# Patient Record
Sex: Male | Born: 1935 | Race: Black or African American | Hispanic: No | Marital: Married | Smoking: Never smoker
Health system: Southern US, Community
[De-identification: ages and names within clinical notes are randomized; demographics above are authoritative.]

## PROBLEM LIST (undated history)

## (undated) DIAGNOSIS — G459 Transient cerebral ischemic attack, unspecified: Secondary | ICD-10-CM

## (undated) DIAGNOSIS — H409 Unspecified glaucoma: Secondary | ICD-10-CM

## (undated) DIAGNOSIS — E7800 Pure hypercholesterolemia, unspecified: Secondary | ICD-10-CM

## (undated) HISTORY — PX: HERNIA REPAIR: SHX51

## (undated) HISTORY — DX: Pure hypercholesterolemia, unspecified: E78.00

## (undated) HISTORY — PX: OTHER SURGICAL HISTORY: SHX169

---

## 2010-11-10 ENCOUNTER — Ambulatory Visit (HOSPITAL_COMMUNITY)
Admission: RE | Admit: 2010-11-10 | Discharge: 2010-11-10 | Disposition: A | Discharge status: Home / Self Care | Payer: Medicare Other | Source: Ambulatory Visit | Attending: Ophthalmology | Admitting: Ophthalmology

## 2010-11-10 DIAGNOSIS — H269 Unspecified cataract: Secondary | ICD-10-CM | POA: Insufficient documentation

## 2012-01-01 ENCOUNTER — Observation Stay (HOSPITAL_COMMUNITY): Payer: Medicare Other

## 2012-01-01 ENCOUNTER — Emergency Department (HOSPITAL_COMMUNITY): Payer: Medicare Other

## 2012-01-01 ENCOUNTER — Observation Stay (HOSPITAL_COMMUNITY)
Admission: EM | Admit: 2012-01-01 | Discharge: 2012-01-02 | Disposition: A | Discharge status: Home / Self Care | Payer: Medicare Other | Attending: Emergency Medicine | Admitting: Emergency Medicine

## 2012-01-01 ENCOUNTER — Encounter (HOSPITAL_COMMUNITY): Payer: Self-pay

## 2012-01-01 ENCOUNTER — Other Ambulatory Visit: Payer: Self-pay

## 2012-01-01 DIAGNOSIS — R29810 Facial weakness: Secondary | ICD-10-CM | POA: Insufficient documentation

## 2012-01-01 DIAGNOSIS — I639 Cerebral infarction, unspecified: Secondary | ICD-10-CM

## 2012-01-01 DIAGNOSIS — H409 Unspecified glaucoma: Secondary | ICD-10-CM | POA: Insufficient documentation

## 2012-01-01 DIAGNOSIS — Z79899 Other long term (current) drug therapy: Secondary | ICD-10-CM | POA: Insufficient documentation

## 2012-01-01 DIAGNOSIS — Z8673 Personal history of transient ischemic attack (TIA), and cerebral infarction without residual deficits: Secondary | ICD-10-CM | POA: Insufficient documentation

## 2012-01-01 DIAGNOSIS — Z7982 Long term (current) use of aspirin: Secondary | ICD-10-CM | POA: Insufficient documentation

## 2012-01-01 DIAGNOSIS — C384 Malignant neoplasm of pleura: Principal | ICD-10-CM | POA: Insufficient documentation

## 2012-01-01 HISTORY — DX: Transient cerebral ischemic attack, unspecified: G45.9

## 2012-01-01 HISTORY — DX: Unspecified glaucoma: H40.9

## 2012-01-01 LAB — PROTIME-INR
INR: 0.92 (ref 0.00–1.49)
Prothrombin Time: 12.3 seconds (ref 11.6–15.2)

## 2012-01-01 LAB — CBC WITH DIFFERENTIAL/PLATELET
Basophils Absolute: 0 10*3/uL (ref 0.0–0.1)
Basophils Relative: 0 % (ref 0–1)
Eosinophils Absolute: 0.1 10*3/uL (ref 0.0–0.7)
Eosinophils Relative: 2 % (ref 0–5)
Lymphocytes Relative: 23 % (ref 12–46)
MCH: 28.9 pg (ref 26.0–34.0)
MCV: 87.7 fL (ref 78.0–100.0)
Platelets: 175 10*3/uL (ref 150–400)
RDW: 13.4 % (ref 11.5–15.5)
WBC: 5.2 10*3/uL (ref 4.0–10.5)

## 2012-01-01 LAB — BASIC METABOLIC PANEL
Calcium: 9.3 mg/dL (ref 8.4–10.5)
GFR calc non Af Amer: 61 mL/min — ABNORMAL LOW (ref >90)
Sodium: 139 mEq/L (ref 135–145)

## 2012-01-01 LAB — LIPID PANEL
HDL: 48 mg/dL (ref >39)
LDL Cholesterol: 63 mg/dL (ref 0–99)

## 2012-01-01 MED ORDER — ASPIRIN 325 MG PO TABS
325.0000 mg | ORAL_TABLET | Freq: Every day | ORAL | Status: DC
  Administered 2012-01-01 – 2012-01-02 (×2): 325 mg via ORAL
  Filled 2012-01-01 (×2): qty 1

## 2012-01-01 MED ORDER — ACETAMINOPHEN 325 MG PO TABS: 650.0000 mg | ORAL_TABLET | ORAL | Status: DC | PRN

## 2012-01-01 NOTE — ED Notes (Addendum)
EKG given to Dr. Bernette Mayers

## 2012-01-01 NOTE — ED Notes (Signed)
Report given to eme, rn.  Pt transported to room.

## 2012-01-01 NOTE — ED Provider Notes (Signed)
History     CSN: [id number]  Arrival date & time 01/01/12  Barry Brunner   First MD Initiated Contact with Patient 01/01/12 1955      Chief Complaint  Patient presents with  . Dizziness    (Consider location/radiation/quality/duration/timing/severity/associated sxs/prior treatment) HPI Pt with remote history of TIA [place] just [place] to arrival. She states he complained of feeling funny in the head and then walking sideways. He then started having trouble getting his words out and had a facial droop. These symptoms lasted about 10-15 minutes but is now feeling better. He is able to talk now, still states he 'feels funny' in his R hand and leg.   Past Medical History  Diagnosis Date  . Glaucoma   . TIA (transient ischemic attack)     Past Surgical History  Procedure Date  . Abdominal surgery     hernia    No family history on file.  History  Substance Use Topics  . Smoking status: Never Smoker   . Smokeless tobacco: Not on file  . Alcohol Use: No      Review of Systems All other systems reviewed and are negative except as noted in HPI.   Allergies  Review of patient's allergies indicates no known allergies.  Home Medications   Current Outpatient Rx  Name  Route  Sig  Dispense  Refill  . ASPIRIN EC 81 MG PO TBEC   Oral   Take 81 mg by mouth daily.         Marland Kitchen BIMATOPROST 0.01 % OP SOLN   Left Eye   Place 1 drop into the left eye at bedtime.         Marland Kitchen SIMVASTATIN 20 MG PO TABS   Oral   Take 20 mg by mouth every evening.           BP 108/56  Pulse 73  Temp 97.9 F (36.6 C) (Oral)  Resp 17  Ht 5\' 6"  (1.676 m)  Wt 155 lb (70.308 kg)  BMI 25.02 kg/m2  SpO2 98%  Physical Exam  Nursing note and vitals reviewed. Constitutional: He is oriented to person, place, and time. He appears well-developed and well-nourished.  HENT:  Head: Normocephalic and atraumatic.  Eyes: EOM are normal. Pupils are equal, round, and reactive to  light.  Neck: Normal range of motion. Neck supple.  Cardiovascular: Normal rate, normal heart sounds and intact distal pulses.   Pulmonary/Chest: Effort normal and breath sounds normal.  Abdominal: Bowel sounds are normal. He exhibits no distension. There is no tenderness.  Musculoskeletal: Normal range of motion. He exhibits no edema and no tenderness.  Neurological: He is alert and oriented to person, place, and time. He has normal strength. He displays normal reflexes. No cranial nerve deficit or sensory deficit. He exhibits normal muscle tone. Coordination normal.  Skin: Skin is warm and dry. No rash noted.  Psychiatric: He has a normal mood and affect.    ED Course  Procedures (including critical care time)  Labs Reviewed  BASIC METABOLIC PANEL - Abnormal; Notable for the following:    Glucose, Bld 111 (*)     GFR calc non Af Amer 61 (*)     GFR calc Af Amer 71 (*)     All other components within normal limits  CBC WITH DIFFERENTIAL  PROTIME-INR  APTT  TROPONIN I  HEMOGLOBIN A1C  LIPID PANEL   Dg Chest 2 View  01/01/2012  *RADIOLOGY REPORT*  Clinical Data: Chest pain.  TIA.  CHEST - 2 VIEW  Comparison: None.  Findings: Normal heart size and pulmonary vascularity.  Slightly shallow inspiration.  Scattered calcified granulomas.  No focal airspace consolidation.  No blunting of costophrenic angles.  No pneumothorax.  Tortuous aorta.  IMPRESSION: No evidence of active pulmonary disease.   Original Report Authenticated By: Burman Nieves, M.D.    Ct Head Wo Contrast  01/01/2012  *RADIOLOGY REPORT*  Clinical Data: Dizziness  CT HEAD WITHOUT CONTRAST  Technique:  Contiguous axial images were obtained from the base of the skull through the vertex without contrast.  Comparison: None.  Findings: No acute intracranial hemorrhage.  No focal mass lesion. No CT evidence of acute infarction.   No midline shift or mass effect.  No hydrocephalus.  Basilar cisterns are patent.  There is mild  deep white matter hypodensities. Paranasal sinuses and mastoid air cells are clear.  Orbits are normal.  IMPRESSION: No acute intracranial findings.   Original Report Authenticated By: Genevive Bi, M.D.      No diagnosis found.    MDM   Date: 01/01/2012  Rate: 55  Rhythm: normal sinus bradycardia rhythm  QRS Axis: normal  Intervals: normal  ST/T Wave abnormalities: normal  Conduction Disutrbances: none  Narrative Interpretation: unremarkable   Pt with likely TIA. Will check CT and labs. Anticipate needing further evaluation on TIA protocol. Pt and family amenable.          Charles B. Bernette Mayers, MD 01/01/12 2150

## 2012-01-01 NOTE — ED Notes (Signed)
Patient transported to MRI 

## 2012-01-01 NOTE — ED Notes (Signed)
[place] got [place] once he stood up to get on bus.  Pt denies any weakness. States he has a slight headache that started when he got dizzy.  Pt does have hx of tia.

## 2012-01-02 DIAGNOSIS — G459 Transient cerebral ischemic attack, unspecified: Secondary | ICD-10-CM

## 2012-01-02 LAB — HEMOGLOBIN A1C
Hgb A1c MFr Bld: 6.1 % — ABNORMAL HIGH (ref <5.7)
Mean Plasma Glucose: 128 mg/dL — ABNORMAL HIGH (ref <117)

## 2012-01-02 MED ORDER — CLOPIDOGREL BISULFATE 75 MG PO TABS: 75.0000 mg | ORAL_TABLET | Freq: Every day | ORAL | Status: DC

## 2012-01-02 NOTE — Progress Notes (Signed)
VASCULAR LAB PRELIMINARY  PRELIMINARY  PRELIMINARY  PRELIMINARY  Carotid Dopplers completed.    Preliminary report:  There is no ICA stenosis.  Vertebral artery flow is antegrade.  Mikela Senn, RVT 01/02/2012, 7:48 AM

## 2012-01-02 NOTE — ED Notes (Signed)
BREAKFAST ORDERED  

## 2012-01-02 NOTE — ED Notes (Signed)
DR ZOXWR HAS REVIEWED RESULTS AND HAS A PAGE IN TO THE STROKE DOCTOR

## 2012-01-02 NOTE — Progress Notes (Signed)
  Echocardiogram 2D Echocardiogram has been performed.  Hayden Ingram 01/02/2012, 8:33 AM

## 2012-01-02 NOTE — ED Notes (Signed)
PT HAS AMBULATED IN HALLS. GAIT IS STEADY. DR Effie Shy AWARE.

## 2012-01-02 NOTE — ED Provider Notes (Signed)
Hayden Ingram is a 76 y.o. male who is in the CDU, overnight, for evaluation of "TIA". The MRI, done at approximately 11:30 PM last night was positive for cerebellar and brainstem infarcts. At this time, the patient states that he does not have dizziness, headache, or paresthesias. He reports having a "stroke in the back of my head', 7 years ago, evaluated, and treated in the [place]. His local PCP, is Dr.Waylon Thea Silversmith. He is alert, cooperative. Vital signs are normal with the exception of minimal hypertension, 153/69.   ED evaluation: MR, as above. Cardiac echo, normal. Lipid panel, normal. Hemoglobin A1c 6.1, risk for diabetes.   Assessment: Completed stroke with resolved symptoms. Patient stable for discharge with outpatient management and treatment.  Diagnosis:1. Acute CVA                  2. elevated Hemoglobin A1c   Case discussed with the stroke neurologist, Dr. Roseanne Reno, who will see him as a consultation, and recommends that he be started on Plavix; and followup with a neurologist, as an outpatient.  Flint Melter, MD 01/02/12 [phone]  Results for orders placed during the hospital encounter of 01/01/12  CBC WITH DIFFERENTIAL      Component Value Range   WBC 5.2  4.0 - 10.5 K/uL   RBC 4.71  4.22 - 5.81 MIL/uL   Hemoglobin 13.6  13.0 - 17.0 g/dL   HCT 24.4  01.0 - 27.2 %   MCV 87.7  78.0 - 100.0 fL   MCH 28.9  26.0 - 34.0 pg   MCHC 32.9  30.0 - 36.0 g/dL   RDW 53.6  64.4 - 03.4 %   Platelets 175  150 - 400 K/uL   Neutrophils Relative 64  43 - 77 %   Neutro Abs 3.3  1.7 - 7.7 K/uL   Lymphocytes Relative 23  12 - 46 %   Lymphs Abs 1.2  0.7 - 4.0 K/uL   Monocytes Relative 10  3 - 12 %   Monocytes Absolute 0.5  0.1 - 1.0 K/uL   Eosinophils Relative 2  0 - 5 %   Eosinophils Absolute 0.1  0.0 - 0.7 K/uL   Basophils Relative 0  0 - 1 %   Basophils Absolute 0.0  0.0 - 0.1 K/uL  BASIC METABOLIC PANEL      Component Value Range   Sodium 139  135 - 145 mEq/L   Potassium 3.9   3.5 - 5.1 mEq/L   Chloride 102  96 - 112 mEq/L   CO2 29  19 - 32 mEq/L   Glucose, Bld 111 (*) 70 - 99 mg/dL   BUN 17  6 - 23 mg/dL   Creatinine, Ser 7.42  0.50 - 1.35 mg/dL   Calcium 9.3  8.4 - 59.5 mg/dL   GFR calc non Af Amer 61 (*) >90 mL/min   GFR calc Af Amer 71 (*) >90 mL/min  PROTIME-INR      Component Value Range   Prothrombin Time 12.3  11.6 - 15.2 seconds   INR 0.92  0.00 - 1.49  APTT      Component Value Range   aPTT 30  24 - 37 seconds  TROPONIN I      Component Value Range   Troponin I <0.30  <0.30 ng/mL  HEMOGLOBIN A1C      Component Value Range   Hemoglobin A1C 6.1 (*) <5.7 %   Mean Plasma Glucose 128 (*) <117 mg/dL  LIPID PANEL  Component Value Range   Cholesterol 125  0 - 200 mg/dL   Triglycerides 72  <454 mg/dL   HDL 48  >09 mg/dL   Total CHOL/HDL Ratio 2.6     VLDL 14  0 - 40 mg/dL   LDL Cholesterol 63  0 - 99 mg/dL    Dg Chest 2 View  81/19/1478  *RADIOLOGY REPORT*  Clinical Data: Chest pain.  TIA.  CHEST - 2 VIEW  Comparison: None.  Findings: Normal heart size and pulmonary vascularity.  Slightly shallow inspiration.  Scattered calcified granulomas.  No focal airspace consolidation.  No blunting of costophrenic angles.  No pneumothorax.  Tortuous aorta.  IMPRESSION: No evidence of active pulmonary disease.   Original Report Authenticated By: Burman Nieves, M.D.    Ct Head Wo Contrast  01/01/2012  *RADIOLOGY REPORT*  Clinical Data: Dizziness  CT HEAD WITHOUT CONTRAST  Technique:  Contiguous axial images were obtained from the base of the skull through the vertex without contrast.  Comparison: None.  Findings: No acute intracranial hemorrhage.  No focal mass lesion. No CT evidence of acute infarction.   No midline shift or mass effect.  No hydrocephalus.  Basilar cisterns are patent.  There is mild deep white matter hypodensities. Paranasal sinuses and mastoid air cells are clear.  Orbits are normal.  IMPRESSION: No acute intracranial findings.    Original Report Authenticated By: Genevive Bi, M.D.    Mr Brain Wo Contrast  01/02/2012  *RADIOLOGY REPORT*  Clinical Data:  Acute onset of dizziness that has now resolved.  MRI HEAD WITHOUT CONTRAST MRA HEAD WITHOUT CONTRAST  Technique:  Multiplanar, multiecho pulse sequences of the brain and surrounding structures were obtained without intravenous contrast. Angiographic images of the head were obtained using MRA technique without contrast.  Comparison:  CT head earlier in the day  MRI HEAD  Findings:  Small cerebellar acute infarcts are seen in the left paramedian  vermis just dorsolateral to the fourth ventricle, as well as the left inferior cerebellar hemisphere.  A third area of acute infarction is in the left lateral midbrain.  These are consistent with acute vertebrobasilar lacunar type ischemia.  There is evidence for chronic ischemia in the left greater than right cerebellar hemispheres with areas of remote lacunar infarction. There is also a remote left paramedian pontine lacunar infarct.  There is no mass lesion, hydrocephalus, or extra-axial fluid.  The brain shows mild age related atrophy.  There is chronic microvascular ischemic change throughout the subcortical greater than periventricular white matter likely related to occult hypertension or subclinical diabetes.  No large vessel infarct to suggest prior vasculitis.  Chronic infection could have a similar appearance but a demyelinating process is not favored.  No foci of chronic hemorrhage.  Calvarium intact.  No acute mastoid, sinus, or orbital pathology.  At the skull base, there is widening of the predental space to 4 mm, which appears filled with fluid.  There does not appear to be erosion of the odontoid although there is slight pannus dorsal to the dens. There is also slight lateral translation C1 on C2 of 2 mm as seen on coronal images.  I do not see a osseous destructive process or occult fracture.  This appearance could be due to  rheumatoid or psoriatic arthritis.  Consider lateral cervical spine flexion/extension views to evaluate for abnormal C1-2 motion.  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  Abnormal widening of the predental space to 4 mm.  Is there history of rheumatoid or psoriatic arthritis?  Consider lateral cervical spine flexion/extension views to evaluate for abnormal movement at this level.  MRA HEAD  Findings: The internal carotid arteries are widely patent but dolichoectatic.  The basilar artery is dolichoectatic but widely patent.  The right vertebral is the dominant contributor to the basilar. The left vertebral displays a 2 cm segment of smooth narrowing beginning above the foramen magnum extending to just below the vertebral confluence consistent with a 75% stenosis, potentially flow reducing.  No visible irregularity.  No signs of dissection.  There is no proximal stenosis of the anterior, middle, or posterior cerebral arteries.  Both superior cerebellar arteries, anterior inferior cerebellar arteries, and posterior inferior cerebral arteries appear patent. There is no intracranial aneurysm.  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  No visible cerebellar branch occlusion.   Original Report Authenticated By: Davonna Belling, M.D.    Mr Mra Head/brain Wo Cm  01/02/2012  *RADIOLOGY REPORT*  Clinical Data:  Acute onset of dizziness that has now resolved.  MRI HEAD WITHOUT CONTRAST MRA HEAD WITHOUT CONTRAST  Technique:  Multiplanar, multiecho pulse sequences of the brain and surrounding structures were obtained without intravenous contrast. Angiographic images of the head were obtained using MRA technique without contrast.  Comparison:  CT head earlier in the day  MRI HEAD  Findings:  Small cerebellar acute infarcts are seen in the left  paramedian  vermis just dorsolateral to the fourth ventricle, as well as the left inferior cerebellar hemisphere.  A third area of acute infarction is in the left lateral midbrain.  These are consistent with acute vertebrobasilar lacunar type ischemia.  There is evidence for chronic ischemia in the left greater than right cerebellar hemispheres with areas of remote lacunar infarction. There is also a remote left paramedian pontine lacunar infarct.  There is no mass lesion, hydrocephalus, or extra-axial fluid.  The brain shows mild age related atrophy.  There is chronic microvascular ischemic change throughout the subcortical greater than periventricular white matter likely related to occult hypertension or subclinical diabetes.  No large vessel infarct to suggest prior vasculitis.  Chronic infection could have a similar appearance but a demyelinating process is not favored.  No foci of chronic hemorrhage.  Calvarium intact.  No acute mastoid, sinus, or orbital pathology.  At the skull base, there is widening of the predental space to 4 mm, which appears filled with fluid.  There does not appear to be erosion of the odontoid although there is slight pannus dorsal to the dens. There is also slight lateral translation C1 on C2 of 2 mm as seen on coronal images.  I do not see a osseous destructive process or occult fracture.  This appearance could be due to rheumatoid or psoriatic arthritis.  Consider lateral cervical spine flexion/extension views to evaluate for abnormal C1-2 motion.  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  Abnormal widening of the predental space to 4 mm.  Is there history of rheumatoid or psoriatic arthritis?  Consider lateral cervical spine flexion/extension views to evaluate for abnormal movement at this level.  MRA HEAD  Findings: The internal carotid  arteries are widely patent but dolichoectatic.  The basilar artery is dolichoectatic but widely patent.  The right vertebral is the dominant contributor to the basilar. The  left vertebral displays a 2 cm segment of smooth narrowing beginning above the foramen magnum extending to just below the vertebral confluence consistent with a 75% stenosis, potentially flow reducing.  No visible irregularity.  No signs of dissection.  There is no proximal stenosis of the anterior, middle, or posterior cerebral arteries.  Both superior cerebellar arteries, anterior inferior cerebellar arteries, and posterior inferior cerebral arteries appear patent. There is no intracranial aneurysm.  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  No visible cerebellar branch occlusion.   Original Report Authenticated By: Davonna Belling, M.D.         Flint Melter, MD 01/02/12 [phone]

## 2012-01-02 NOTE — ED Notes (Signed)
Dr Effie Shy has been in to reeval pt and discuss results to this point with pt

## 2012-01-02 NOTE — ED Notes (Signed)
LUNCH ORDERED 

## 2012-03-31 ENCOUNTER — Other Ambulatory Visit: Payer: Self-pay | Admitting: Neurology

## 2012-04-12 ENCOUNTER — Ambulatory Visit (INDEPENDENT_AMBULATORY_CARE_PROVIDER_SITE_OTHER): Payer: Medicare Other

## 2012-04-12 DIAGNOSIS — Z0289 Encounter for other administrative examinations: Secondary | ICD-10-CM

## 2012-04-12 DIAGNOSIS — I6350 Cerebral infarction due to unspecified occlusion or stenosis of unspecified cerebral artery: Secondary | ICD-10-CM

## 2012-06-15 ENCOUNTER — Ambulatory Visit: Payer: Self-pay | Admitting: Neurology

## 2012-09-23 ENCOUNTER — Encounter: Payer: Self-pay | Admitting: Neurology

## 2012-09-27 ENCOUNTER — Ambulatory Visit (INDEPENDENT_AMBULATORY_CARE_PROVIDER_SITE_OTHER): Payer: Medicare Other | Admitting: Neurology

## 2012-09-27 ENCOUNTER — Encounter: Payer: Self-pay | Admitting: Neurology

## 2012-09-27 VITALS — BP 130/74 | HR 77 | Temp 98.3°F | Ht 65.5 in | Wt 158.0 lb

## 2012-09-27 DIAGNOSIS — E782 Mixed hyperlipidemia: Secondary | ICD-10-CM

## 2012-09-27 DIAGNOSIS — I6381 Other cerebral infarction due to occlusion or stenosis of small artery: Secondary | ICD-10-CM | POA: Insufficient documentation

## 2012-09-27 DIAGNOSIS — I6789 Other cerebrovascular disease: Secondary | ICD-10-CM

## 2012-09-27 DIAGNOSIS — E7849 Other hyperlipidemia: Secondary | ICD-10-CM | POA: Insufficient documentation

## 2012-09-27 NOTE — Patient Instructions (Addendum)
Continue Plavix for secondary stroke prevention and strict control of lipids with LDL cholesterol goal below 100 mg percent. Check lipid profile at next visit with primary physician Dr. Ronne Binning. Return for followup in 6 months with Levester Fresh, NP or call earlier if necessary.

## 2012-09-27 NOTE — Progress Notes (Signed)
[facility] [address] [place]. [place] [address] [phone]       OFFICE FOLLOW-UP NOTE  Mr. 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 Update 09/27/12 he returns for followup after last visit on 02/25/12. He continues to do well without any recurrence stroke or TIA symptoms. Is tolerating Plavix well without significant bleeding, bruising or other side effects. He is unsure as to when he had his last lipid profile checked by Dr. Ronne Binning but does have an upcoming appointment to see him. He had followup carotid ultrasound on 04/12/12 which showed no significant extracranial stenosis. Transcranial Doppler study was limited and showed mild velocity elevations in both middle cerebral and right intracerebral arteries but without significant stenosis.  ROS:   14 system review of systems is positive for constipation only. PMH:  Past Medical History  Diagnosis Date  . Glaucoma   . TIA (transient ischemic attack)   . High cholesterol     Social History:  History   Social History  . Marital Status: Married    Spouse Name: N/A    Number of Children: 1  . Years of Education: 6th   Occupational History  . retired    Social History Main Topics  . Smoking status: Never Smoker   . Smokeless tobacco: Not on file  . Alcohol Use: No  . Drug Use: No  . Sexual Activity: Not on file   Other Topics Concern  . Not on file   Social History Narrative  . No narrative on file    Medications:   Current Outpatient Prescriptions on File Prior to Visit  Medication Sig Dispense Refill  . aspirin EC 81 MG tablet Take 81 mg by mouth daily.      . bimatoprost (LUMIGAN) 0.01 % SOLN Place 1 drop into the left eye at bedtime.      . clopidogrel (PLAVIX) 75 MG tablet Take  1 tablet (75 mg total) by mouth daily.  30 tablet  0  . simvastatin (ZOCOR) 20 MG tablet Take 20 mg by mouth every evening.       No current facility-administered medications on file prior to visit.    Allergies:  No Known Allergies  Physical Exam General: well developed, well nourished, seated, in no evident distress Head: head normocephalic and atraumatic. Orohparynx benign Neck: supple with no carotid or supraclavicular bruits Cardiovascular: regular rate and rhythm, no murmurs Musculoskeletal: no deformity Skin:  no rash/petichiae Vascular:  Normal pulses all extremities Filed Vitals:   09/27/12 1514  BP: 130/74  Pulse: 77  Temp: 98.3 F (36.8 C)    Neurologic Exam Mental Status: Awake and fully alert. Oriented to place and time. Recent and remote memory intact. Attention span, concentration and fund of knowledge appropriate. Mood and affect appropriate.  Cranial Nerves: Fundoscopic exam reveals sharp disc margins. Pupils equal, briskly reactive to light. Extraocular movements full without nystagmus. Visual fields full to confrontation. Hearing intact. Facial sensation intact. Face, tongue, palate moves normally and symmetrically.  Motor: Normal bulk and tone. Normal strength in all tested extremity muscles.diminished fine finger movements on left and orbits right over left upper extremity. Sensory.: intact to touch and pinprick and vibratory sensation.  Coordination: Rapid alternating movements normal in all extremities. Finger-to-nose and heel-to-shin performed accurately bilaterally. Gait and Station:  Arises from chair without difficulty. Stance is normal. Gait demonstrates normal stride length and balance . Able to heel, toe and tandem walk without difficulty.  Reflexes: 1+ and symmetric. Toes downgoing.       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   PLAN: Continue Plavix for secondary stroke prevention and strict control of lipids with LDL cholesterol goal below 100 mg percent. Check lipid profile at next visit with primary physician Dr. Ronne Binning. Return for followup in 6 months with Levester Fresh, NP or call earlier if necessary.

## 2013-03-28 ENCOUNTER — Ambulatory Visit: Payer: Medicare Other | Admitting: Nurse Practitioner

## 2013-04-12 ENCOUNTER — Ambulatory Visit: Payer: Medicare Other | Admitting: Nurse Practitioner

## 2013-04-20 ENCOUNTER — Encounter: Payer: Self-pay | Admitting: Nurse Practitioner

## 2013-04-20 ENCOUNTER — Ambulatory Visit (INDEPENDENT_AMBULATORY_CARE_PROVIDER_SITE_OTHER): Payer: Medicare Other | Admitting: Nurse Practitioner

## 2013-04-20 ENCOUNTER — Encounter (INDEPENDENT_AMBULATORY_CARE_PROVIDER_SITE_OTHER): Payer: Self-pay

## 2013-04-20 VITALS — BP 124/76 | HR 78 | Ht 65.0 in | Wt 158.0 lb

## 2013-04-20 DIAGNOSIS — I6789 Other cerebrovascular disease: Secondary | ICD-10-CM

## 2013-04-20 DIAGNOSIS — E7849 Other hyperlipidemia: Secondary | ICD-10-CM

## 2013-04-20 DIAGNOSIS — E782 Mixed hyperlipidemia: Secondary | ICD-10-CM

## 2013-04-20 MED ORDER — CLOPIDOGREL BISULFATE 75 MG PO TABS: 75.0000 mg | ORAL_TABLET | Freq: Every day | ORAL | Status: DC

## 2013-04-20 NOTE — Progress Notes (Signed)
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   Update 04/20/13 (LL): He returns for followup visit after last visit on 09/27/2012. He continues to do well without any recurrence stroke or TIA symptoms. Is tolerating Plavix well without significant bleeding, bruising or other side effects. He has his regular physical appointment with Dr. Alyson Ingles in one month. His followup carotid ultrasound on 04/12/2012 showed no significant extracranial stenosis.  He states his blood pressure is well controlled it is 124/76 in the office today.  ROS:  14 system review of systems is positive for chest pain and constipation, snoring only.  ALLERGIES: No Known Allergies  HOME MEDICATIONS: Outpatient Prescriptions Prior to Visit  Medication Sig Dispense Refill  . bimatoprost (LUMIGAN) 0.01 % SOLN Place 1 drop into the left eye at bedtime.      . simvastatin (ZOCOR) 20 MG tablet Take 20 mg by mouth every evening.      . clopidogrel (PLAVIX) 75 MG tablet Take 1 tablet (75 mg total) by mouth daily.  30 tablet  0   No facility-administered medications prior to visit.     PHYSICAL EXAM  Filed Vitals:   04/20/13 0925  BP: 124/76  Pulse: 78  Height: 5\' 5"  (1.651 m)  Weight: 158 lb (71.668 kg)   Body mass index is 26.29 kg/(m^2).  Physical Exam  General: well developed, well nourished, seated, in no evident distress  Neck: supple with no carotid or supraclavicular bruits  Cardiovascular: regular rate and rhythm, no murmurs  Musculoskeletal: no deformity   Neurologic Exam  Mental Status: Awake and fully alert. Oriented to place and time. Recent and remote memory intact. Attention span, concentration and fund of knowledge appropriate.  Mood and affect appropriate.  Cranial Nerves: Fundoscopic exam not done. Pupils equal, briskly reactive to light. Extraocular movements full without nystagmus. Visual fields full to confrontation. Hearing intact. Facial sensation intact. Face, tongue, palate moves normally and symmetrically.  Motor: Normal bulk and tone. Normal strength in all tested extremity muscles.diminished fine finger movements on left and orbits right over left upper extremity.  Sensory.: intact to touch and pinprick and vibratory sensation.  Coordination: Rapid alternating movements normal in all extremities. Finger-to-nose and heel-to-shin performed accurately bilaterally.  Gait and Station: Arises from chair without difficulty. Stance is normal. Gait demonstrates normal stride length and balance . Able to heel, toe and tandem walk without difficulty.  Reflexes: 1+ and symmetric.   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   PLAN:  Continue Plavix for secondary stroke prevention and strict control of lipids with LDL cholesterol goal below 100 mg percent. Check lipid profile at next visit with primary physician Dr. Alyson Ingles. Return for followup in 6 months with or call earlier if necessary.   Meds ordered this encounter  Medications  . clopidogrel (PLAVIX) 75 MG tablet    Sig: Take 1 tablet (75 mg total) by mouth daily.    Dispense:  90 tablet    Refill:  03    Order Specific Question:  Supervising Provider    Answer:  Penni Bombard [[id number]]   Return in about 1 year (around 04/21/2014).  Philmore Pali, MSN, NP-C 04/20/2013, 12:25 PM [facility] [address] [place], [place] [address] [phone]  Note: This document was prepared with digital dictation and possible smart phrase technology. Any transcriptional errors that result from  this process are unintentional.

## 2013-04-20 NOTE — Patient Instructions (Signed)
  PLAN:  Continue Plavix for secondary stroke prevention and strict control of lipids with LDL cholesterol goal below 100 mg percent. Check lipid profile at next visit with primary physician Dr. Alyson Ingles.  Return for followup in 6 months or call earlier if necessary.

## 2013-12-05 ENCOUNTER — Encounter: Payer: Self-pay | Admitting: Neurology

## 2014-04-23 ENCOUNTER — Ambulatory Visit: Payer: Medicare Other | Admitting: Nurse Practitioner

## 2014-05-02 ENCOUNTER — Ambulatory Visit (INDEPENDENT_AMBULATORY_CARE_PROVIDER_SITE_OTHER): Payer: Medicare Other | Admitting: Nurse Practitioner

## 2014-05-02 ENCOUNTER — Encounter: Payer: Self-pay | Admitting: Nurse Practitioner

## 2014-05-02 VITALS — BP 131/77 | HR 85 | Resp 14 | Ht 65.5 in | Wt 162.0 lb

## 2014-05-02 DIAGNOSIS — I639 Cerebral infarction, unspecified: Secondary | ICD-10-CM | POA: Diagnosis not present

## 2014-05-02 DIAGNOSIS — E784 Other hyperlipidemia: Secondary | ICD-10-CM

## 2014-05-02 DIAGNOSIS — E7849 Other hyperlipidemia: Secondary | ICD-10-CM

## 2014-05-02 DIAGNOSIS — I6381 Other cerebral infarction due to occlusion or stenosis of small artery: Secondary | ICD-10-CM

## 2014-05-02 MED ORDER — CLOPIDOGREL BISULFATE 75 MG PO TABS: 75.0000 mg | ORAL_TABLET | Freq: Every day | ORAL | Status: DC

## 2014-05-02 NOTE — Patient Instructions (Signed)
Continue Plavix for secondary stroke prevention, will refill Strict control of lipids followed by primary care, LDL goal below 100% Check carotid Doppler Follow-up in one year or sooner if problems arise

## 2014-05-02 NOTE — Progress Notes (Signed)
I agree with the above plan 

## 2014-05-02 NOTE — Progress Notes (Signed)
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   Update 05/02/14 Mr. Heckendorn, 79 year old male returns old male returns for follow-up. He was last seen 04/20/2013 .He continues to do well without any recurrence stroke or TIA symptoms. Is tolerating Plavix well without significant bleeding, bruising or other side effects. He has regular labs for lipid management  with Dr. Alyson Ingles every 6 months.  His last  carotid ultrasound on 04/12/2012 showed no significant extracranial stenosis. He states his blood pressure is well controlled it is 131/77 in the office today. He gets no regular exercise   REVIEW OF SYSTEMS: Full 14 system review of systems performed and notable only for those listed, all others are neg:  Constitutional: neg  Cardiovascular: neg Ear/Nose/Throat: neg  Skin: neg Eyes: Itching  Respiratory: neg Gastroitestinal: Frequency of urination Hematology/Lymphatic: neg  Endocrine: neg Musculoskeletal: Neck Stiffness Allergy/Immunology: Environmental allergies Neurological: neg Psychiatric: neg Sleep : Daytime sleepiness   ALLERGIES: No Known Allergies  HOME MEDICATIONS: Outpatient Prescriptions Prior to Visit  Medication Sig Dispense Refill  . clopidogrel (PLAVIX) 75 MG tablet Take 1 tablet (75 mg total) by mouth daily. 90 tablet 03  . simvastatin (ZOCOR) 20 MG tablet Take 20 mg by mouth every evening.    . bimatoprost (LUMIGAN) 0.01 % SOLN Place 1 drop into the left eye at bedtime.     No facility-administered medications prior to visit.    PAST MEDICAL HISTORY: Past Medical History  Diagnosis Date  . Glaucoma   . TIA (transient ischemic attack)     . High cholesterol     PAST SURGICAL HISTORY: Past Surgical History  Procedure Laterality Date  . Abdominal surgery      hernia  . Right eye      FAMILY HISTORY: Family History  Problem Relation Age of Onset  . Blindness Father     SOCIAL HISTORY: History   Social History  . Marital Status: Married    Spouse Name: Rise Paganini  . Number of Children: 1  . Years of Education: 6th   Occupational History  . retired    Social History Main Topics  . Smoking status: Never Smoker   . Smokeless tobacco: Never Used  . Alcohol Use: No  . Drug Use: No  . Sexual Activity: Not on file   Other Topics Concern  . Not on file   Social History Narrative   Patient lives at home with his wife Rise Paganini)   Retired   Education grade school education   Right handed   Caffeine un sweet tea once daily.      PHYSICAL EXAM  Filed Vitals:   05/02/14 1426  BP: 131/77  Pulse: 85  Resp: 14  Height: 5' 5.5" (1.664 m)  Weight: 162 lb (73.483 kg)   Body mass index is 26.54 kg/(m^2). General: well developed, well nourished, seated, in no evident distress  Neck: supple with no carotid or supraclavicular bruits  Cardiovascular: regular rate and rhythm, no murmurs  Musculoskeletal: no deformity   Neurologic Exam  Mental Status: Awake and fully alert. Oriented to place and time. Recent and remote memory intact. Attention span, concentration and fund of knowledge appropriate. Mood and affect appropriate.  Cranial Nerves: Fundoscopic exam not done. Pupils  equal, briskly reactive to light. Extraocular movements full without nystagmus. Visual fields full to confrontation. Hearing intact. Facial sensation intact. Face, tongue, palate moves normally and symmetrically.  Motor: Normal bulk and tone. Normal strength in all tested extremity muscles.diminished fine finger movements on left and orbits right over left upper extremity.  Sensory.: intact to touch and pinprick and vibratory sensation.   Coordination: Rapid alternating movements normal in all extremities. Finger-to-nose and heel-to-shin performed accurately bilaterally.  Gait and Station: Arises from chair without difficulty. Stance is normal. Gait demonstrates normal stride length and balance . Able to heel, toe and unsteady with tandem walk   Reflexes: 1+ and symmetric.    DIAGNOSTIC DATA (LABS, IMAGING, TESTING) - ASSESSMENT AND PLAN  79 y.o. year old male  has a past medical history of Glaucoma; TIA (transient ischemic attack); and High cholesterol. here to follow-up. He had multiple posterior circulatory lacunar infarcts in December 2013 from small vessel disease with vascular risk factors of hyperlipidemia.  Continue Plavix for secondary stroke prevention, will refill Strict control of lipids followed by primary care, LDL goal below 100% Check carotid Doppler follow-up from previous Follow-up in one year or sooner if problems arise Dennie Bible, [facility], [facility], APRN  [facility] [address] [place], [place] [address] [phone]

## 2014-05-24 ENCOUNTER — Ambulatory Visit (INDEPENDENT_AMBULATORY_CARE_PROVIDER_SITE_OTHER): Payer: Medicare Other

## 2014-05-24 DIAGNOSIS — E7849 Other hyperlipidemia: Secondary | ICD-10-CM

## 2014-05-24 DIAGNOSIS — I639 Cerebral infarction, unspecified: Secondary | ICD-10-CM | POA: Diagnosis not present

## 2014-05-24 DIAGNOSIS — I6381 Other cerebral infarction due to occlusion or stenosis of small artery: Secondary | ICD-10-CM

## 2014-05-31 ENCOUNTER — Telehealth: Payer: Self-pay | Admitting: Nurse Practitioner

## 2014-05-31 NOTE — Telephone Encounter (Signed)
Study negative for significant stenosis involving extracranial carotid arteries. Please call the patient

## 2014-06-01 NOTE — Telephone Encounter (Signed)
VM left to inform patient to call Office to receive results concerning stenosis involving carotid arteries.  Thanks

## 2014-06-01 NOTE — Telephone Encounter (Signed)
Patient called stating he was returning a call regarding his results. Please call and advise. Patient can be reached @ [phone]

## 2014-06-01 NOTE — Telephone Encounter (Signed)
Patient is returning a call regarding doppler results.

## 2014-06-04 NOTE — Telephone Encounter (Signed)
Spoke with pt concerning Doppler results, he has no further questions at this time.  Thanks

## 2015-05-23 ENCOUNTER — Other Ambulatory Visit: Payer: Self-pay | Admitting: Nurse Practitioner

## 2015-12-24 DIAGNOSIS — Z79899 Other long term (current) drug therapy: Secondary | ICD-10-CM | POA: Diagnosis not present

## 2015-12-24 DIAGNOSIS — K5900 Constipation, unspecified: Secondary | ICD-10-CM | POA: Diagnosis not present

## 2015-12-24 DIAGNOSIS — Z125 Encounter for screening for malignant neoplasm of prostate: Secondary | ICD-10-CM | POA: Diagnosis not present

## 2015-12-24 DIAGNOSIS — H44513 Absolute glaucoma, bilateral: Secondary | ICD-10-CM | POA: Diagnosis not present

## 2015-12-24 DIAGNOSIS — I7389 Other specified peripheral vascular diseases: Secondary | ICD-10-CM | POA: Diagnosis not present

## 2016-01-17 ENCOUNTER — Encounter (HOSPITAL_COMMUNITY): Payer: Self-pay | Admitting: *Deleted

## 2016-01-17 ENCOUNTER — Emergency Department (HOSPITAL_COMMUNITY)
Admission: EM | Admit: 2016-01-17 | Discharge: 2016-01-17 | Disposition: A | Discharge status: Home / Self Care | Payer: Medicare Other | Attending: Emergency Medicine | Admitting: Emergency Medicine

## 2016-01-17 DIAGNOSIS — K625 Hemorrhage of anus and rectum: Secondary | ICD-10-CM

## 2016-01-17 DIAGNOSIS — Z8673 Personal history of transient ischemic attack (TIA), and cerebral infarction without residual deficits: Secondary | ICD-10-CM | POA: Diagnosis not present

## 2016-01-17 LAB — COMPREHENSIVE METABOLIC PANEL
ALBUMIN: 3.5 g/dL (ref 3.5–5.0)
ALT: 26 U/L (ref 17–63)
AST: 28 U/L (ref 15–41)
Alkaline Phosphatase: 51 U/L (ref 38–126)
Anion gap: 10 (ref 5–15)
BUN: 13 mg/dL (ref 6–20)
CHLORIDE: 107 mmol/L (ref 101–111)
CO2: 24 mmol/L (ref 22–32)
CREATININE: 0.89 mg/dL (ref 0.61–1.24)
Calcium: 9 mg/dL (ref 8.9–10.3)
GFR calc Af Amer: >60 mL/min (ref >60)
GFR calc non Af Amer: >60 mL/min (ref >60)
GLUCOSE: 69 mg/dL (ref 65–99)
POTASSIUM: 4 mmol/L (ref 3.5–5.1)
Sodium: 141 mmol/L (ref 135–145)
Total Bilirubin: 0.4 mg/dL (ref 0.3–1.2)
Total Protein: 7.4 g/dL (ref 6.5–8.1)

## 2016-01-17 LAB — POC OCCULT BLOOD, ED
Fecal Occult Bld: NEGATIVE
Fecal Occult Bld: POSITIVE — AB

## 2016-01-17 LAB — CBC
HEMATOCRIT: 36.5 % — AB (ref 39.0–52.0)
Hemoglobin: 11.8 g/dL — ABNORMAL LOW (ref 13.0–17.0)
MCH: 28.8 pg (ref 26.0–34.0)
MCHC: 32.3 g/dL (ref 30.0–36.0)
MCV: 89 fL (ref 78.0–100.0)
PLATELETS: 220 10*3/uL (ref 150–400)
RBC: 4.1 MIL/uL — ABNORMAL LOW (ref 4.22–5.81)
RDW: 13.7 % (ref 11.5–15.5)
WBC: 4.5 10*3/uL (ref 4.0–10.5)

## 2016-01-17 NOTE — ED Notes (Signed)
ED Provider at bedside. 

## 2016-01-17 NOTE — ED Triage Notes (Signed)
C/o rectal bleeding with stools onset wed. States he isn't having any pain .

## 2016-01-17 NOTE — ED Notes (Signed)
EDP at bedside  

## 2016-01-17 NOTE — ED Notes (Signed)
Pt and pt's family agitated about the wait. Delay explained. EDP aware.

## 2016-01-17 NOTE — Discharge Instructions (Signed)
Read the information below.  Your hemoccult was positive on repeat. There were no obvious hemorrhoids on exam. Your hemoglobin is 11.8 today.  Please call your GI specialist ASAP to schedule a follow up appointment for further evaluation. You may return to the Emergency Department at any time for worsening condition or any new symptoms that concern you. Return to ED if you develop chest pain, shortness of breath, abdominal pain, generalized fatigue, dizziness, lightheadedness, loss of consciousness, or any other new/concerning symptoms.

## 2016-01-17 NOTE — ED Provider Notes (Signed)
[facility] DEPT Provider Note   CSN: [id number] Arrival date & time: 01/17/16  1220     History   Chief Complaint Chief Complaint  Patient presents with  . Rectal Bleeding    HPI Hayden Ingram is a 80 y.o. male.  Hayden Ingram is a 81 y.o. male with h/o high cholesterol and glaucoma presents to ED with complaint of blood in stool. Patient noted dark blood in stool approximately 2 days. He was seen by his PCP today and prompted to come to ED for evaluation. Pt reports approximately 4 bloody bowel movements. He endorses associated constipation and unexpected weight loss of approximately 15lb in 6 months. He denies fever, change in appetite, abdominal pain, N/V, chest pain, SOB, dizziness, lightheadedness, syncope, dysuria, hematuria, or any other complaints. Denies h/o of GI bleeds of diverticulosis. He underwent colonoscopy on December 6th, he is unclear of the results. He reports taking plavix and asa daily. He denies NSAID or pepto-bismol use. Pt takes a MV, but no other overt iron supplementation. No recent beet ingestion. No h/o cancer.       Past Medical History:  Diagnosis Date  . Glaucoma   . High cholesterol   . TIA (transient ischemic attack)     Patient Active Problem List   Diagnosis Date Noted  . Lacunar infarction ([place]) 09/27/2012  . Essential familial hyperlipidemia 09/27/2012    Past Surgical History:  Procedure Laterality Date  . HERNIA REPAIR    . right eye         Home Medications    Prior to Admission medications   Medication Sig Start Date End Date Taking? Authorizing Provider  clopidogrel (PLAVIX) 75 MG tablet TAKE 1 TABLET (75 MG TOTAL) BY MOUTH DAILY. 05/23/15   Garvin Fila, MD  latanoprost (XALATAN) 0.005 % ophthalmic solution  04/23/14   Historical Provider, MD  simvastatin (ZOCOR) 20 MG tablet Take 20 mg by mouth every evening.    Historical Provider, MD    Family History Family History  Problem Relation Age of Onset  .  Blindness Father     Social History Social History  Substance Use Topics  . Smoking status: Never Smoker  . Smokeless tobacco: Never Used  . Alcohol use No     Allergies   Patient has no known allergies.   Review of Systems Review of Systems  Constitutional: Negative for fever.  HENT: Negative for trouble swallowing.   Eyes: Negative for visual disturbance.  Respiratory: Negative for shortness of breath.   Cardiovascular: Negative for chest pain.  Gastrointestinal: Positive for blood in stool and constipation. Negative for abdominal pain, nausea and vomiting.  Genitourinary: Negative for dysuria and hematuria.  Musculoskeletal: Negative for arthralgias and myalgias.  Skin: Negative for rash.  Neurological: Negative for dizziness, syncope and light-headedness.     Physical Exam Updated Vital Signs BP 122/56   Pulse 61   Temp 97.9 F (36.6 C) (Oral)   Resp 18   Ht 5\' 6"  (1.676 m)   Wt 64.9 kg   SpO2 100%   BMI 23.08 kg/m   Physical Exam  Constitutional: He appears well-developed and well-nourished. No distress.  HENT:  Head: Normocephalic and atraumatic.  Mouth/Throat: Oropharynx is clear and moist. No oropharyngeal exudate.  Eyes: Conjunctivae and EOM are normal. Pupils are equal, round, and reactive to light. Right eye exhibits no discharge. Left eye exhibits no discharge. No scleral icterus.  Neck: Normal range of motion and phonation normal. Neck supple. No neck rigidity.  Normal range of motion present.  Cardiovascular: Normal rate, regular rhythm, normal heart sounds and intact distal pulses.   No murmur heard. Pulmonary/Chest: Effort normal and breath sounds normal. No stridor. No respiratory distress. He has no wheezes. He has no rales.  Abdominal: Soft. Bowel sounds are normal. He exhibits no distension. There is no tenderness. There is no rigidity, no rebound, no guarding and no CVA tenderness.  Genitourinary: Rectum normal and prostate normal.    Genitourinary Comments: Chaperone present for duration of exam. No external hemorrhoids or fissures noted. Rectal tone is normal. No tenderness. No masses palpated. Dark blood noted on rectal exam.   Musculoskeletal: Normal range of motion.  Lymphadenopathy:    He has no cervical adenopathy.  Neurological: He is alert. He is not disoriented. Coordination and gait normal. GCS eye subscore is 4. GCS verbal subscore is 5. GCS motor subscore is 6.  Skin: Skin is warm and dry. He is not diaphoretic.  Psychiatric: He has a normal mood and affect. His behavior is normal.     ED Treatments / Results  Labs (all labs ordered are listed, but only abnormal results are displayed) Labs Reviewed  CBC - Abnormal; Notable for the following:       Result Value   RBC 4.10 (*)    Hemoglobin 11.8 (*)    HCT 36.5 (*)    All other components within normal limits  POC OCCULT BLOOD, ED - Abnormal; Notable for the following:    Fecal Occult Bld POSITIVE (*)    All other components within normal limits  COMPREHENSIVE METABOLIC PANEL  POC OCCULT BLOOD, ED    EKG  EKG Interpretation None       Radiology No results found.  Procedures LOWER ENDOSCOPY Date/Time: 01/17/2016 6:40 PM Performed by: Fatima Blank Authorized by: Fatima Blank  Consent: Verbal consent obtained. Risks and benefits: risks, benefits and alternatives were discussed Consent given by: patient Patient understanding: patient states understanding of the procedure being performed Patient identity confirmed: arm band Time out: Immediately prior to procedure a "time out" was called to verify the correct patient, procedure, equipment, support staff and site/side marked as required. Indications: rectal bleeding  Sedation: Patient sedated: no Scope type: anoscope External exam performed: yes Negative external exam findings: no pilonidal cyst, no perianal skin tags, no perianal induration, no perianal erythema and  no external hemorrhoids Digital exam performed: yes Positive digital exam findings: occult blood in stool Negative digital exam findings: no laxity of anal sphincter and no prostate tenderness Negative internal exam findings: no internal hemorrhoid, no anal fissures, no anal fistulae and no anal stricture Procedure termination: procedure complete Patient tolerance: Patient tolerated the procedure well with no immediate complications Comments: Bloody stool noted above visualized rectum     (including critical care time)  Medications Ordered in ED Medications - No data to display   Initial Impression / Assessment and Plan / ED Course  I have reviewed the triage vital signs and the nursing notes.  Pertinent labs & imaging results that were available during my care of the patient were reviewed by me and considered in my medical decision making (see chart for details).  Clinical Course     Patient presents to ED with complaint of blood in stool x 2 days. Patient is afebrile and non-toxic appearing in NAD. VSS.  Heart RRR. Lungs CTABL. Abdomen soft, non-tender, non-distended. Dark blood noted on DRE, no TTP or external hemorrhoids. Anoscope performed by Dr. Leonette Monarch  shows no internal hemorrhoids, blood noted in rectum. Patient did have colonoscopy performed in early December, he is unclear of results and unable to visualize results here. Initial hemoccult negative, repeat positive. Mild anemia present. Pt asx. CMP unremarkable. Unclear source of bleed - ?secondary from colonoscopy at beginning of month vs. Higher in GI tract. Given stable VSS and hemoglobin, benign abdominal exam, no rectal pain on DRE, no leukocytosis pt safe for d/c with close f/u with GI. Discussed pt with Dr. Leonette Monarch who also evaluated pt, agrees with plan.    Discussed results and plan with pt. Encouraged close follow up with GI specialist. Strict return precautions given. Pt voiced understanding and is agreeable.   Final  Clinical Impressions(s) / ED Diagnoses   Final diagnoses:  Rectal bleeding    New Prescriptions Discharge Medication List as of 01/17/2016 10:37 PM       Roxanna Mew, PA-C 01/18/16 [id number]    Fatima Blank, MD 01/21/16 [phone]

## 2016-01-21 NOTE — ED Provider Notes (Signed)
Medical screening examination/treatment/procedure(s) were conducted as a shared visit with non-physician practitioner(s) and myself.  I personally evaluated the patient during the encounter. Briefly, the patient is a 81 y.o. male with h/o high cholesterol and glaucoma presents to ED with complaint of blood in stool. Patient noted dark blood in stool approximately 2 days. Work notable for +hemacult. Anoscope w/o internal hemorrhoids. Bloody stool noted above rectum; likely more proximal source. Upon further questioning, pt reported recent colonoscopy, but uncertain if they obtained biopsy or of findings. No colonoscopy noted in our system here or care everywhere. Pt already established with GI. Hb stable. Will have pt follow with their GI. The patient is safe for discharge with strict return precautions.    Fatima Blank, MD 01/21/16 1640

## 2016-01-22 DIAGNOSIS — R195 Other fecal abnormalities: Secondary | ICD-10-CM | POA: Diagnosis not present

## 2016-01-22 DIAGNOSIS — I6789 Other cerebrovascular disease: Secondary | ICD-10-CM | POA: Diagnosis not present

## 2016-01-22 DIAGNOSIS — I7389 Other specified peripheral vascular diseases: Secondary | ICD-10-CM | POA: Diagnosis not present

## 2016-01-22 DIAGNOSIS — K625 Hemorrhage of anus and rectum: Secondary | ICD-10-CM | POA: Diagnosis not present

## 2016-01-22 DIAGNOSIS — K5900 Constipation, unspecified: Secondary | ICD-10-CM | POA: Diagnosis not present

## 2016-01-28 ENCOUNTER — Other Ambulatory Visit: Payer: Self-pay

## 2016-01-28 DIAGNOSIS — I7092 Chronic total occlusion of artery of the extremities: Secondary | ICD-10-CM

## 2016-02-04 DIAGNOSIS — K625 Hemorrhage of anus and rectum: Secondary | ICD-10-CM | POA: Diagnosis not present

## 2016-02-04 DIAGNOSIS — D123 Benign neoplasm of transverse colon: Secondary | ICD-10-CM | POA: Diagnosis not present

## 2016-02-04 DIAGNOSIS — D120 Benign neoplasm of cecum: Secondary | ICD-10-CM | POA: Diagnosis not present

## 2016-02-04 DIAGNOSIS — K5730 Diverticulosis of large intestine without perforation or abscess without bleeding: Secondary | ICD-10-CM | POA: Diagnosis not present

## 2016-02-04 DIAGNOSIS — K921 Melena: Secondary | ICD-10-CM | POA: Diagnosis not present

## 2016-02-04 DIAGNOSIS — D122 Benign neoplasm of ascending colon: Secondary | ICD-10-CM | POA: Diagnosis not present

## 2016-02-04 DIAGNOSIS — K635 Polyp of colon: Secondary | ICD-10-CM | POA: Diagnosis not present

## 2016-02-10 DIAGNOSIS — H401121 Primary open-angle glaucoma, left eye, mild stage: Secondary | ICD-10-CM | POA: Diagnosis not present

## 2016-02-10 DIAGNOSIS — Z961 Presence of intraocular lens: Secondary | ICD-10-CM | POA: Diagnosis not present

## 2016-02-10 DIAGNOSIS — H401113 Primary open-angle glaucoma, right eye, severe stage: Secondary | ICD-10-CM | POA: Diagnosis not present

## 2016-02-10 DIAGNOSIS — H2511 Age-related nuclear cataract, right eye: Secondary | ICD-10-CM | POA: Diagnosis not present

## 2016-02-17 DIAGNOSIS — H44513 Absolute glaucoma, bilateral: Secondary | ICD-10-CM | POA: Diagnosis not present

## 2016-02-17 DIAGNOSIS — R972 Elevated prostate specific antigen [PSA]: Secondary | ICD-10-CM | POA: Diagnosis not present

## 2016-02-17 DIAGNOSIS — I7389 Other specified peripheral vascular diseases: Secondary | ICD-10-CM | POA: Diagnosis not present

## 2016-03-02 ENCOUNTER — Encounter: Payer: Self-pay | Admitting: Vascular Surgery

## 2016-03-10 ENCOUNTER — Encounter: Payer: Self-pay | Admitting: Vascular Surgery

## 2016-03-10 ENCOUNTER — Ambulatory Visit (INDEPENDENT_AMBULATORY_CARE_PROVIDER_SITE_OTHER): Payer: Medicare Other | Admitting: Vascular Surgery

## 2016-03-10 ENCOUNTER — Ambulatory Visit (HOSPITAL_COMMUNITY)
Admission: RE | Admit: 2016-03-10 | Discharge: 2016-03-10 | Disposition: A | Discharge status: Home / Self Care | Payer: Medicare Other | Source: Ambulatory Visit | Attending: Vascular Surgery | Admitting: Vascular Surgery

## 2016-03-10 VITALS — BP 131/77 | HR 71 | Temp 98.8°F | Resp 20 | Ht 66.0 in | Wt 145.0 lb

## 2016-03-10 DIAGNOSIS — Z87891 Personal history of nicotine dependence: Secondary | ICD-10-CM | POA: Diagnosis not present

## 2016-03-10 DIAGNOSIS — I7092 Chronic total occlusion of artery of the extremities: Secondary | ICD-10-CM

## 2016-03-10 DIAGNOSIS — R938 Abnormal findings on diagnostic imaging of other specified body structures: Secondary | ICD-10-CM | POA: Insufficient documentation

## 2016-03-10 DIAGNOSIS — R252 Cramp and spasm: Secondary | ICD-10-CM | POA: Diagnosis not present

## 2016-03-10 DIAGNOSIS — R0989 Other specified symptoms and signs involving the circulatory and respiratory systems: Secondary | ICD-10-CM | POA: Diagnosis present

## 2016-03-10 DIAGNOSIS — E785 Hyperlipidemia, unspecified: Secondary | ICD-10-CM | POA: Insufficient documentation

## 2016-03-10 NOTE — Progress Notes (Signed)
[facility]  Patient name: Hayden Ingram MRN: [id number] DOB: Jun 04, 1935 Sex: male  REASON FOR CONSULT: Evaluation of leg cramping  HPI: Cobe Samaniego is a 81 y.o. male, who is here today for evaluation of leg cramping. He is quite active. He denies any true calf claudication symptoms. He reports that his cramping occurs mostly at night is typical cramping in bed. He has not had any tissue loss or arterial rest pain symptoms. Does have a remote history of stroke which she reports was at work up and apparently showed a lacunar infarct. No history of carotid disease.  Past Medical History:  Diagnosis Date  . Glaucoma   . High cholesterol   . TIA (transient ischemic attack)     Family History  Problem Relation Age of Onset  . Blindness Father     SOCIAL HISTORY: Social History   Social History  . Marital status: Married    Spouse name: Rise Paganini  . Number of children: 1  . Years of education: 6th   Occupational History  . retired    Social History Main Topics  . Smoking status: Never Smoker  . Smokeless tobacco: Never Used  . Alcohol use No  . Drug use: No  . Sexual activity: Not on file   Other Topics Concern  . Not on file   Social History Narrative   Patient lives at home with his wife Rise Paganini)   Retired   Education grade school education   Right handed   Caffeine un sweet tea once daily.     No Known Allergies  Current Outpatient Prescriptions  Medication Sig Dispense Refill  . aspirin 81 MG tablet Take 81 mg by mouth daily.    . clopidogrel (PLAVIX) 75 MG tablet TAKE 1 TABLET (75 MG TOTAL) BY MOUTH DAILY. 30 tablet 0  . simvastatin (ZOCOR) 20 MG tablet Take 20 mg by mouth every evening.    . latanoprost (XALATAN) 0.005 % ophthalmic solution   3   No current facility-administered medications for this visit.     REVIEW OF SYSTEMS:  [X]  denotes positive finding, [ ]  denotes negative  finding Cardiac  Comments:  Chest pain or chest pressure:     Shortness of breath upon exertion:    Short of breath when lying flat:    Irregular heart rhythm:        Vascular    Pain in calf, thigh, or hip brought on by ambulation:    Pain in feet at night that wakes you up from your sleep:     Blood clot in your veins:    Leg swelling:  x       Pulmonary    Oxygen at home:    Productive cough:     Wheezing:         Neurologic    Sudden weakness in arms or legs:  x   Sudden numbness in arms or legs:  x   Sudden onset of difficulty speaking or slurred speech:    Temporary loss of vision in one eye:  x   Problems with dizziness:         Gastrointestinal    Blood in stool:     Vomited blood:         Genitourinary    Burning when urinating:     Blood in urine:        Psychiatric    Major depression:  Hematologic    Bleeding problems:    Problems with blood clotting too easily:        Skin    Rashes or ulcers: x       Constitutional    Fever or chills:      PHYSICAL EXAM: Vitals:   03/10/16 1000  BP: 131/77  Pulse: 71  Resp: 20  Temp: 98.8 F (37.1 C)  TempSrc: Oral  SpO2: 97%  Weight: 145 lb (65.8 kg)  Height: 5\' 6"  (1.676 m)    GENERAL: The patient is a well-nourished male, in no acute distress. The vital signs are documented above. CARDIOVASCULAR: 2+ radial 2+ femoral and 1-2+ posterior tibial pulses bilaterally PULMONARY: There is good air exchange  ABDOMEN: Soft and non-tender . No aneurysm palpable MUSCULOSKELETAL: There are no major deformities or cyanosis. NEUROLOGIC: No focal weakness or paresthesias are detected. SKIN: There are no ulcers or rashes noted. PSYCHIATRIC: The patient has a normal affect.  DATA:  Noninvasive studies today reveal normal ankle arm index bilaterally and triphasic waveforms in the posterior tibial and dorsalis pedis bilaterally  MEDICAL ISSUES: Discuss these findings with the patient. I do not see any  evidence of arterial insufficiency as a cause of his symptoms. He is encouraged to continue his walking program. He will see [place] again on an as-needed basis   Rosetta Posner, MD [facility] Tel [phone] Pager [phone]

## 2016-03-18 DIAGNOSIS — R972 Elevated prostate specific antigen [PSA]: Secondary | ICD-10-CM | POA: Diagnosis not present

## 2016-03-18 DIAGNOSIS — H44513 Absolute glaucoma, bilateral: Secondary | ICD-10-CM | POA: Diagnosis not present

## 2016-04-20 DIAGNOSIS — R350 Frequency of micturition: Secondary | ICD-10-CM | POA: Diagnosis not present

## 2016-04-20 DIAGNOSIS — N401 Enlarged prostate with lower urinary tract symptoms: Secondary | ICD-10-CM | POA: Diagnosis not present

## 2016-04-20 DIAGNOSIS — R972 Elevated prostate specific antigen [PSA]: Secondary | ICD-10-CM | POA: Diagnosis not present

## 2016-06-09 DIAGNOSIS — H401113 Primary open-angle glaucoma, right eye, severe stage: Secondary | ICD-10-CM | POA: Diagnosis not present

## 2016-06-09 DIAGNOSIS — Z961 Presence of intraocular lens: Secondary | ICD-10-CM | POA: Diagnosis not present

## 2016-06-09 DIAGNOSIS — H2511 Age-related nuclear cataract, right eye: Secondary | ICD-10-CM | POA: Diagnosis not present

## 2016-06-09 DIAGNOSIS — H401121 Primary open-angle glaucoma, left eye, mild stage: Secondary | ICD-10-CM | POA: Diagnosis not present

## 2016-07-02 DIAGNOSIS — H44513 Absolute glaucoma, bilateral: Secondary | ICD-10-CM | POA: Diagnosis not present

## 2016-07-02 DIAGNOSIS — R972 Elevated prostate specific antigen [PSA]: Secondary | ICD-10-CM | POA: Diagnosis not present

## 2016-08-19 DIAGNOSIS — R972 Elevated prostate specific antigen [PSA]: Secondary | ICD-10-CM | POA: Diagnosis not present

## 2016-08-26 DIAGNOSIS — R350 Frequency of micturition: Secondary | ICD-10-CM | POA: Diagnosis not present

## 2016-08-26 DIAGNOSIS — R972 Elevated prostate specific antigen [PSA]: Secondary | ICD-10-CM | POA: Diagnosis not present

## 2016-08-26 DIAGNOSIS — N401 Enlarged prostate with lower urinary tract symptoms: Secondary | ICD-10-CM | POA: Diagnosis not present

## 2016-09-30 DIAGNOSIS — H44513 Absolute glaucoma, bilateral: Secondary | ICD-10-CM | POA: Diagnosis not present

## 2016-10-13 DIAGNOSIS — H401113 Primary open-angle glaucoma, right eye, severe stage: Secondary | ICD-10-CM | POA: Diagnosis not present

## 2016-10-13 DIAGNOSIS — H2511 Age-related nuclear cataract, right eye: Secondary | ICD-10-CM | POA: Diagnosis not present

## 2016-10-13 DIAGNOSIS — Z961 Presence of intraocular lens: Secondary | ICD-10-CM | POA: Diagnosis not present

## 2016-10-13 DIAGNOSIS — H401121 Primary open-angle glaucoma, left eye, mild stage: Secondary | ICD-10-CM | POA: Diagnosis not present

## 2016-12-30 DIAGNOSIS — H44513 Absolute glaucoma, bilateral: Secondary | ICD-10-CM | POA: Diagnosis not present

## 2017-01-10 ENCOUNTER — Encounter (HOSPITAL_COMMUNITY): Payer: Self-pay | Admitting: *Deleted

## 2017-01-10 ENCOUNTER — Emergency Department (HOSPITAL_COMMUNITY): Payer: Medicare Other

## 2017-01-10 ENCOUNTER — Other Ambulatory Visit: Payer: Self-pay

## 2017-01-10 ENCOUNTER — Emergency Department (HOSPITAL_COMMUNITY)
Admission: EM | Admit: 2017-01-10 | Discharge: 2017-01-10 | Disposition: A | Discharge status: Home / Self Care | Payer: Medicare Other | Attending: Emergency Medicine | Admitting: Emergency Medicine

## 2017-01-10 DIAGNOSIS — Z79899 Other long term (current) drug therapy: Secondary | ICD-10-CM | POA: Diagnosis not present

## 2017-01-10 DIAGNOSIS — Y999 Unspecified external cause status: Secondary | ICD-10-CM | POA: Diagnosis not present

## 2017-01-10 DIAGNOSIS — R51 Headache: Secondary | ICD-10-CM | POA: Diagnosis not present

## 2017-01-10 DIAGNOSIS — S0993XA Unspecified injury of face, initial encounter: Secondary | ICD-10-CM | POA: Diagnosis not present

## 2017-01-10 DIAGNOSIS — Y929 Unspecified place or not applicable: Secondary | ICD-10-CM | POA: Diagnosis not present

## 2017-01-10 DIAGNOSIS — W06XXXA Fall from bed, initial encounter: Secondary | ICD-10-CM | POA: Diagnosis not present

## 2017-01-10 DIAGNOSIS — T148XXA Other injury of unspecified body region, initial encounter: Secondary | ICD-10-CM | POA: Diagnosis not present

## 2017-01-10 DIAGNOSIS — Z8673 Personal history of transient ischemic attack (TIA), and cerebral infarction without residual deficits: Secondary | ICD-10-CM | POA: Diagnosis not present

## 2017-01-10 DIAGNOSIS — Z7982 Long term (current) use of aspirin: Secondary | ICD-10-CM | POA: Insufficient documentation

## 2017-01-10 DIAGNOSIS — S0083XA Contusion of other part of head, initial encounter: Secondary | ICD-10-CM | POA: Insufficient documentation

## 2017-01-10 DIAGNOSIS — W19XXXA Unspecified fall, initial encounter: Secondary | ICD-10-CM

## 2017-01-10 DIAGNOSIS — Y939 Activity, unspecified: Secondary | ICD-10-CM | POA: Insufficient documentation

## 2017-01-10 DIAGNOSIS — S0080XA Unspecified superficial injury of other part of head, initial encounter: Secondary | ICD-10-CM | POA: Diagnosis present

## 2017-01-10 DIAGNOSIS — S199XXA Unspecified injury of neck, initial encounter: Secondary | ICD-10-CM | POA: Diagnosis not present

## 2017-01-10 DIAGNOSIS — S0990XA Unspecified injury of head, initial encounter: Secondary | ICD-10-CM | POA: Diagnosis not present

## 2017-01-10 DIAGNOSIS — R9431 Abnormal electrocardiogram [ECG] [EKG]: Secondary | ICD-10-CM | POA: Diagnosis not present

## 2017-01-10 NOTE — ED Notes (Signed)
Ice pack applied to left upper face.

## 2017-01-10 NOTE — ED Provider Notes (Signed)
TIME SEEN: 3:39 AM  CHIEF COMPLAINT: Head injury, right-sided facial pain and swelling  HPI: Patient is a 81 year old male with history of TIA on aspirin and Plavix, hyperlipidemia, bilateral glaucoma who presents to the emergency department after he fell out of bed.  He states that he was dreaming and he must of rolled into the floor.  Struck the right side of his face.  Denies neck or back pain.  No chest or abdominal pain.  No extremity pain.  No numbness, tingling or focal weakness.  Has swelling around the left eye but when it opened fully with assistance patient denies any eye pain or vision changes.  ROS: See HPI Constitutional: no fever  Eyes: no drainage  ENT: no runny nose   Cardiovascular:  no chest pain  Resp: no SOB  GI: no vomiting GU: no dysuria Integumentary: no rash  Allergy: no hives  Musculoskeletal: no leg swelling  Neurological: no slurred speech ROS otherwise negative  PAST MEDICAL HISTORY/PAST SURGICAL HISTORY:  Past Medical History:  Diagnosis Date  . Glaucoma   . High cholesterol   . TIA (transient ischemic attack)     MEDICATIONS:  Prior to Admission medications   Medication Sig Start Date End Date Taking? Authorizing Provider  aspirin 81 MG tablet Take 81 mg by mouth daily.    [provider]  clopidogrel (PLAVIX) 75 MG tablet TAKE 1 TABLET (75 MG TOTAL) BY MOUTH DAILY. 05/23/15   Garvin Fila, MD  latanoprost (XALATAN) 0.005 % ophthalmic solution  04/23/14   [provider]  simvastatin (ZOCOR) 20 MG tablet Take 20 mg by mouth every evening.    [provider]    ALLERGIES:  No Known Allergies  SOCIAL HISTORY:  Social History   Tobacco Use  . Smoking status: Never Smoker  . Smokeless tobacco: Never Used  Substance Use Topics  . Alcohol use: No    FAMILY HISTORY: Family History  Problem Relation Age of Onset  . Blindness Father     EXAM: BP (!) 154/112   Pulse 77   Temp 97.9 F (36.6 C) (Oral)   Resp 18    Ht 5\' 6"  (1.676 m)   Wt 66.2 kg (146 lb)   SpO2 100%   BMI 23.57 kg/m  CONSTITUTIONAL: Alert and oriented and responds appropriately to questions. Well-appearing; well-nourished; GCS 15 HEAD: Normocephalic; patient has significant swelling around the right eye into the right cheek with associated abrasion EYES: Conjunctivae cloudy bilaterally, PERRL, EOMI, no hyphema, no subconjunctival hemorrhage, reports normal vision, no proptosis, no tearing or drainage ENT: normal nose; no rhinorrhea; moist mucous membranes; pharynx without lesions noted; no dental injury; no septal hematoma, patient does have some dried blood in both nostrils but no active bleeding NECK: Supple, no meningismus, no LAD; no midline spinal tenderness, step-off or deformity; trachea midline CARD: RRR; S1 and S2 appreciated; no murmurs, no clicks, no rubs, no gallops RESP: Normal chest excursion without splinting or tachypnea; breath sounds clear and equal bilaterally; no wheezes, no rhonchi, no rales; no hypoxia or respiratory distress CHEST:  chest wall stable, no crepitus or ecchymosis or deformity, nontender to palpation; no flail chest ABD/GI: Normal bowel sounds; non-distended; soft, non-tender, no rebound, no guarding; no ecchymosis or other lesions noted PELVIS:  stable, nontender to palpation BACK:  The back appears normal and is non-tender to palpation, there is no CVA tenderness; no midline spinal tenderness, step-off or deformity EXT: Normal ROM in all joints; non-tender to palpation; no  edema; normal capillary refill; no cyanosis, no bony tenderness or bony deformity of patient's extremities, no joint effusion, compartments are soft, extremities are warm and well-perfused, no ecchymosis SKIN: Normal color for age and race; warm NEURO: Moves all extremities equally, reports normal sensation diffusely, cranial nerves II through XII intact, normal speech PSYCH: The patient's mood and manner are appropriate. Grooming  and personal hygiene are appropriate.  MEDICAL DECISION MAKING: Patient here with mechanical fall out of bed.  Patient has swelling noted to the left side of his face.  Normal vision.  Will obtain CT of his head, cervical spine and face.  He declines pain medication.  Otherwise neurologically intact without complaints.  EKG shows no acute abnormality.  ED PROGRESS: CT scans show no acute intracranial abnormality.  No skull fracture or facial fracture.  He does have a hematoma to the left side of his face.  Have advised him to keep his head elevated and apply ice.  He has had ice on his face the entire time here in the emergency department.  No fracture that is acute of the cervical spine.  There is anterior subluxation of the right C1-C2 that is chronic and has similar appearance to MRI in 2013.  Patient comfortable with plan for discharge home.  Discussed head injury return precautions.  He has a PCP for follow-up as needed.  Recommended Tylenol for pain control at home.  Patient does not think that he needs any stronger pain medication at this time.   At this time, I do not feel there is any life-threatening condition present. I have reviewed and discussed all results (EKG, imaging, lab, urine as appropriate) and exam findings with patient/family. I have reviewed nursing notes and appropriate previous records.  I feel the patient is safe to be discharged home without further emergent workup and can continue workup as an outpatient as needed. Discussed usual and customary return precautions. Patient/family verbalize understanding and are comfortable with this plan.  Outpatient follow-up has been provided if needed. All questions have been answered.       EKG Interpretation  Date/Time:  Sunday January 10 2017 03:36:32 EST Ventricular Rate:  59 PR Interval:    QRS Duration: 98 QT Interval:  410 QTC Calculation: 407 R Axis:   64 Text Interpretation:  Sinus rhythm Minimal ST elevation, anterior  leads Baseline wander in lead(s) I III aVL Confirmed by Pryor Curia [phone]) on 01/10/2017 4:03:12 AM             Derwin Reddy, Delice Bison, DO 01/10/17 0263

## 2017-01-10 NOTE — ED Triage Notes (Signed)
The pt fell out of bed at home after having a bad dream.  He arrived by gems from home  The pt is on blood thinners  He has pain in his nose with deformity cut under his lt eye c/o rt shoulder pain also  Alert no loc vitals good

## 2017-01-10 NOTE — Discharge Instructions (Signed)
You may take Tylenol 1000 mg every 6 hours as needed for pain. °

## 2017-02-15 DIAGNOSIS — H2511 Age-related nuclear cataract, right eye: Secondary | ICD-10-CM | POA: Diagnosis not present

## 2017-02-15 DIAGNOSIS — H401113 Primary open-angle glaucoma, right eye, severe stage: Secondary | ICD-10-CM | POA: Diagnosis not present

## 2017-02-15 DIAGNOSIS — Z961 Presence of intraocular lens: Secondary | ICD-10-CM | POA: Diagnosis not present

## 2017-02-15 DIAGNOSIS — H401121 Primary open-angle glaucoma, left eye, mild stage: Secondary | ICD-10-CM | POA: Diagnosis not present

## 2017-02-16 DIAGNOSIS — R972 Elevated prostate specific antigen [PSA]: Secondary | ICD-10-CM | POA: Diagnosis not present

## 2017-02-24 DIAGNOSIS — R350 Frequency of micturition: Secondary | ICD-10-CM | POA: Diagnosis not present

## 2017-02-24 DIAGNOSIS — N401 Enlarged prostate with lower urinary tract symptoms: Secondary | ICD-10-CM | POA: Diagnosis not present

## 2017-02-24 DIAGNOSIS — R972 Elevated prostate specific antigen [PSA]: Secondary | ICD-10-CM | POA: Diagnosis not present

## 2017-03-30 DIAGNOSIS — H44513 Absolute glaucoma, bilateral: Secondary | ICD-10-CM | POA: Diagnosis not present

## 2017-06-15 DIAGNOSIS — H401121 Primary open-angle glaucoma, left eye, mild stage: Secondary | ICD-10-CM | POA: Diagnosis not present

## 2017-06-15 DIAGNOSIS — H401113 Primary open-angle glaucoma, right eye, severe stage: Secondary | ICD-10-CM | POA: Diagnosis not present

## 2017-06-15 DIAGNOSIS — Z961 Presence of intraocular lens: Secondary | ICD-10-CM | POA: Diagnosis not present

## 2017-06-15 DIAGNOSIS — H2511 Age-related nuclear cataract, right eye: Secondary | ICD-10-CM | POA: Diagnosis not present

## 2017-07-28 DIAGNOSIS — K5900 Constipation, unspecified: Secondary | ICD-10-CM | POA: Diagnosis not present

## 2017-07-28 DIAGNOSIS — E782 Mixed hyperlipidemia: Secondary | ICD-10-CM | POA: Diagnosis not present

## 2017-07-28 DIAGNOSIS — Z6824 Body mass index (BMI) 24.0-24.9, adult: Secondary | ICD-10-CM | POA: Diagnosis not present

## 2017-07-28 DIAGNOSIS — H44513 Absolute glaucoma, bilateral: Secondary | ICD-10-CM | POA: Diagnosis not present

## 2017-10-15 DIAGNOSIS — H401113 Primary open-angle glaucoma, right eye, severe stage: Secondary | ICD-10-CM | POA: Diagnosis not present

## 2017-10-15 DIAGNOSIS — H401121 Primary open-angle glaucoma, left eye, mild stage: Secondary | ICD-10-CM | POA: Diagnosis not present

## 2017-10-15 DIAGNOSIS — H2511 Age-related nuclear cataract, right eye: Secondary | ICD-10-CM | POA: Diagnosis not present

## 2017-10-15 DIAGNOSIS — Z961 Presence of intraocular lens: Secondary | ICD-10-CM | POA: Diagnosis not present

## 2017-12-21 ENCOUNTER — Other Ambulatory Visit: Payer: Self-pay

## 2017-12-21 NOTE — Patient Outreach (Signed)
[facility]) Care Management  12/21/2017  Jamicheal Heard 05/27/1935 [id number]   Medication Adherence call to Mr. Lemoine Foxworthy left a message for patient to call back patient is due on Simvastatin 20 mg.Mr. Patti is showing past due under [facility].    [facility] Management Direct Dial [phone]  Fax [phone] [email or web address]

## 2019-09-10 IMAGING — CT CT MAXILLOFACIAL W/O CM
5 of 14 series · 16 of 47 positions shown, 17 images · non-contrast
Comparison: Brain MRI 01/02/2012 and 01/01/2012 head CT

CLINICAL DATA: Posttraumatic headache after falling from bed. Anti
coagulation therapy.

EXAM:
CT HEAD WITHOUT CONTRAST
CT MAXILLOFACIAL WITHOUT CONTRAST
CT CERVICAL SPINE WITHOUT CONTRAST
TECHNIQUE: Multidetector CT imaging of the head, cervical spine, and
maxillofacial structures were performed using the standard protocol
without intravenous contrast. Multiplanar CT image reconstructions
of the cervical spine and maxillofacial structures were also
generated.

[Series 4: head bone · axial · 0.48mm/px · z∈[+1359,+1435]mm · 3 of 77 slices shown]
[im 20/77  bone]
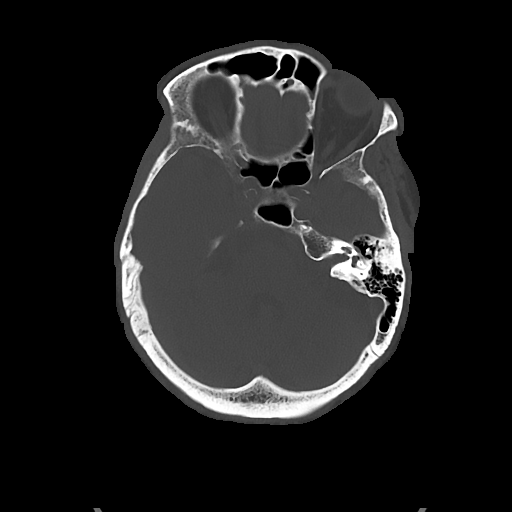
[im 39/77  bone]
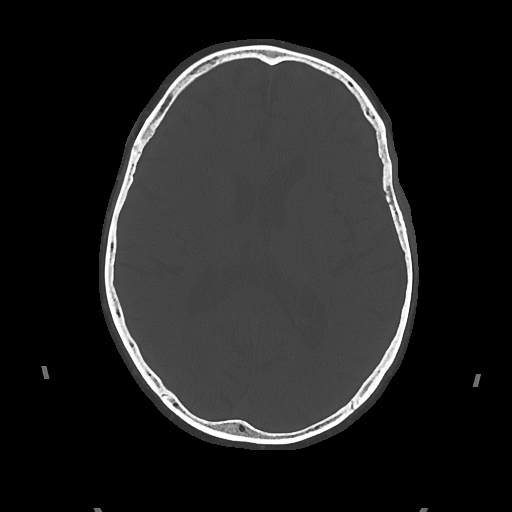
[im 58/77  bone]
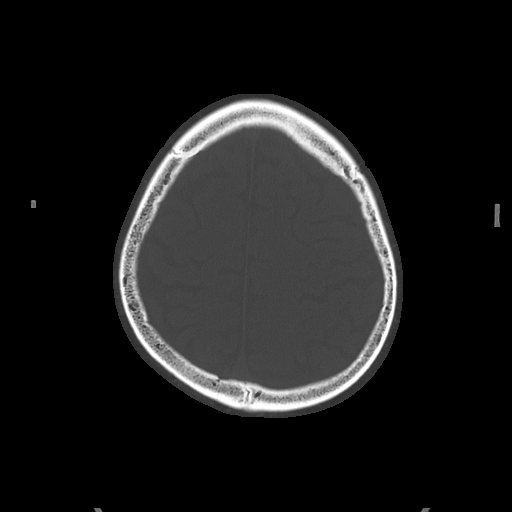

[Series 8: maxilllofacial 2.0 hr40 3 · axial · 0.36mm/px · z∈[+1253,+1357]mm · 4 of 88 slices shown, 5 images]
[im 18/88  brain]
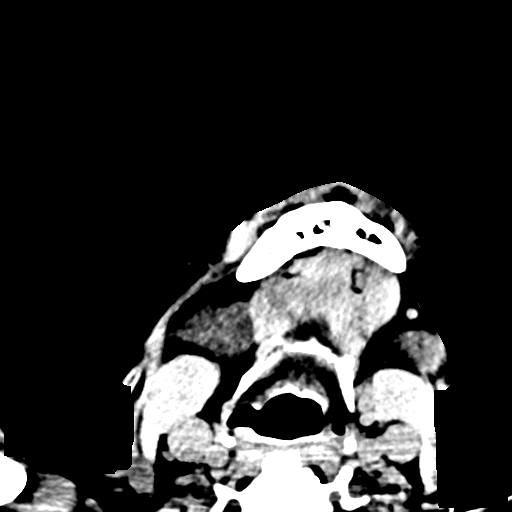
[im 18/88  bone]
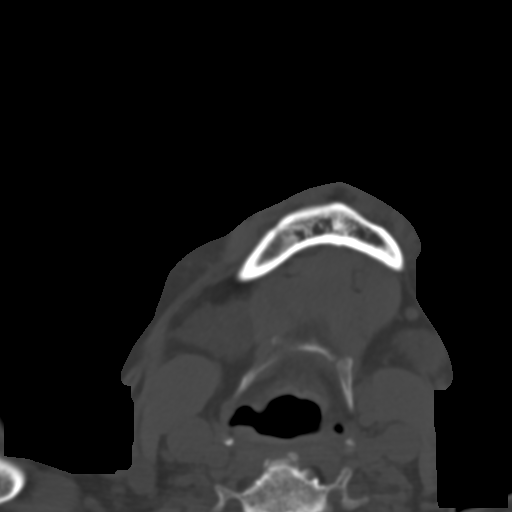
[im 35/88  bone]
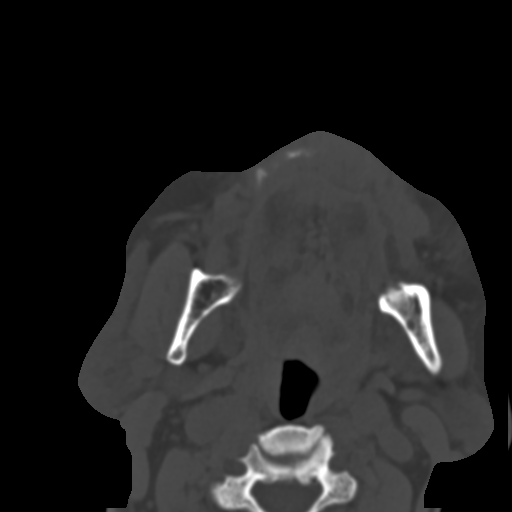
[im 53/88  bone]
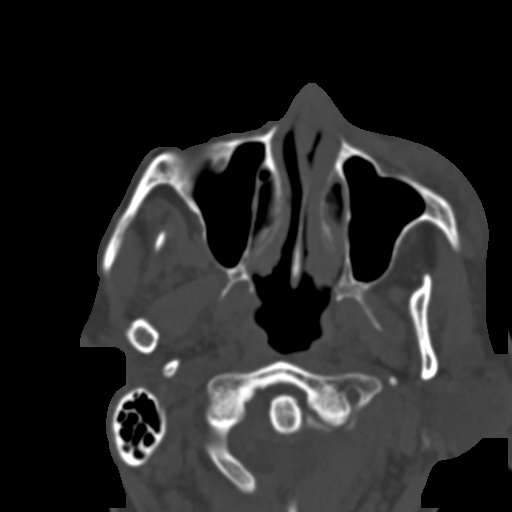
[im 70/88  bone]
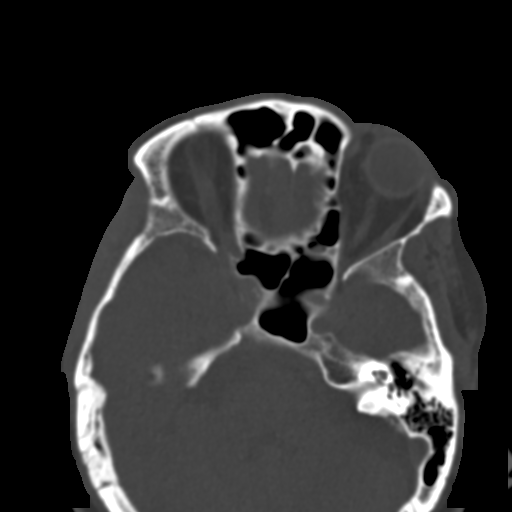

[Series 10: maxilllofacial 2.0 hr59 3 · axial · 0.36mm/px · z∈[+1253,+1357]mm · 4 of 88 slices shown]
[im 18/88  bone]
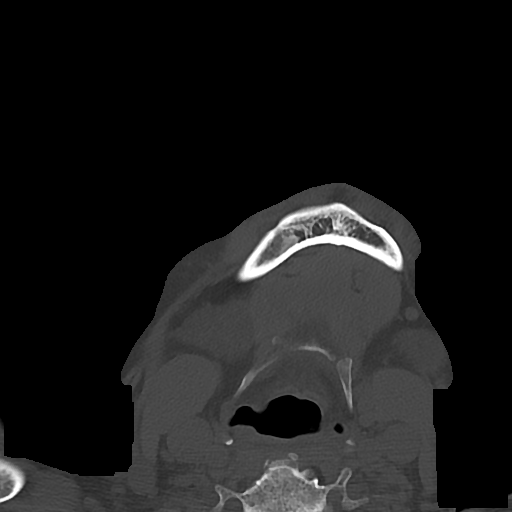
[im 35/88  bone]
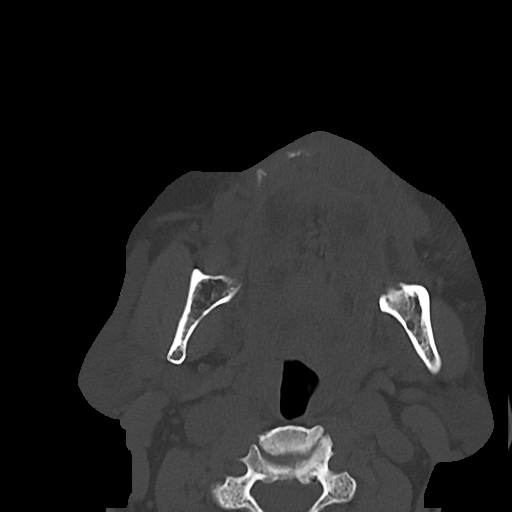
[im 53/88  bone]
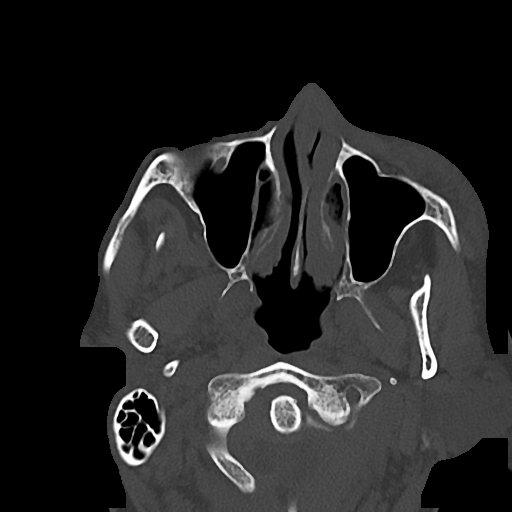
[im 70/88  bone]
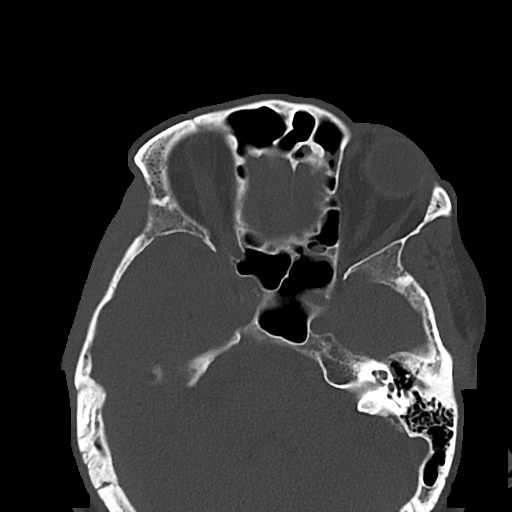

[Series 12: st cor · coronal · 0.34mm/px · 1 of 80 slices shown]
[im 40/80  bone]
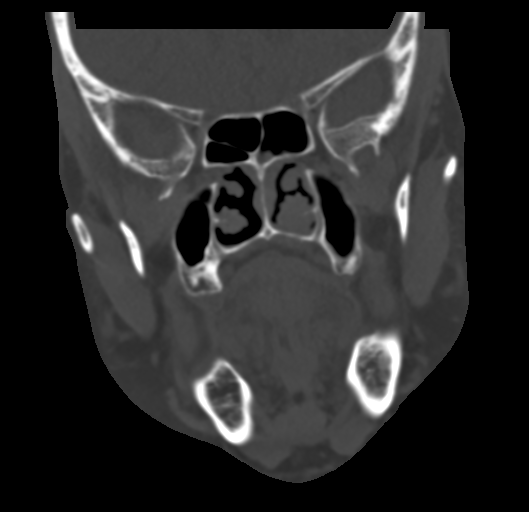

[Series 20: orthogonal axials · axial · 0.21mm/px · z∈[+1211,+1302]mm · 4 of 91 slices shown]
[im 19/91  bone]
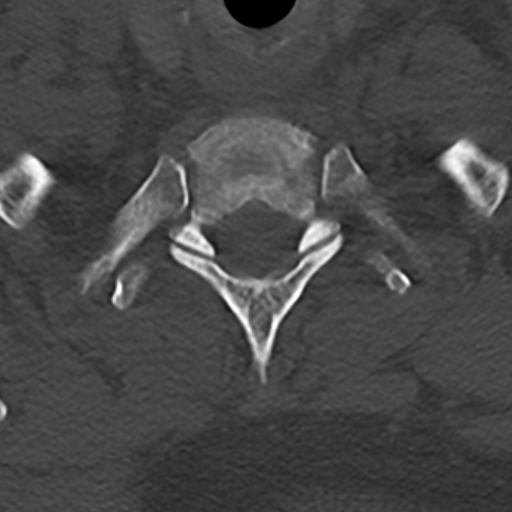
[im 37/91  bone]
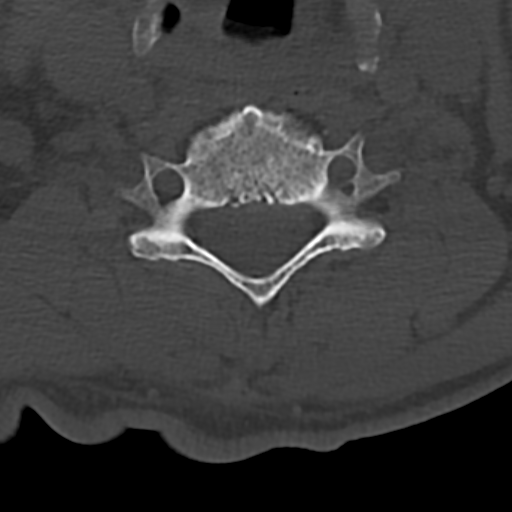
[im 55/91  bone]
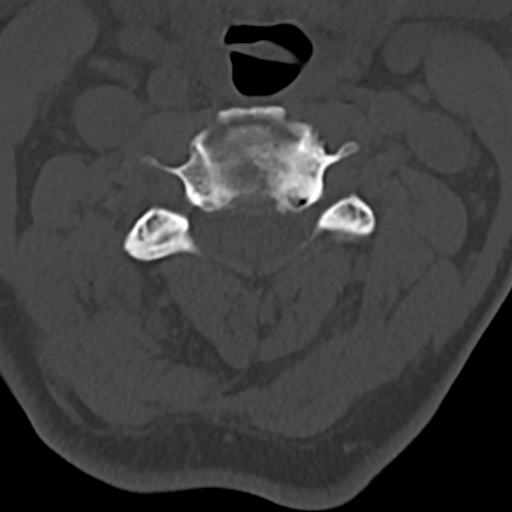
[im 73/91  bone]
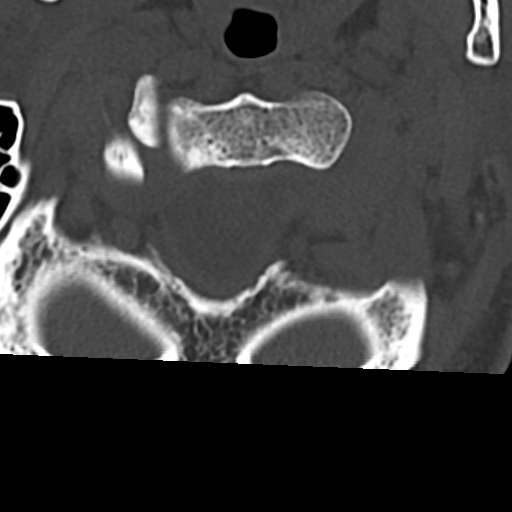

[16 of 47 positions shown; findings below may reference images not displayed]

FINDINGS: CT HEAD FINDINGS

Brain: No mass lesion, intraparenchymal hemorrhage or extra-axial
collection. No evidence of acute cortical infarct. Old left
cerebellar infarct. Brain parenchyma is otherwise normal for age.

Vascular: Atherosclerotic calcification of the internal carotid
arteries at the skull base.

Skull: No calvarial fracture.  No skullbase fracture.

CT MAXILLOFACIAL FINDINGS

Osseous:

--Complex facial fracture types: No [place], zygomaticomaxillary
complex or nasoorbitoethmoidal fracture.

--Simple fracture types: None.

--Mandible: No fracture or dislocation.

Orbits: The globes are intact. Normal appearance of the intra- and
extraconal fat. Symmetric extraocular muscles and optic nerves.

Sinuses: No fluid levels or advanced mucosal thickening.

Soft tissues: Intermediate sized left periorbital and nail are
hematoma and swelling. There is also hematoma at the left nasolabial
fold.

CT CERVICAL SPINE FINDINGS

Alignment: There is anterior subluxation of the right C1 facet
relative to C2. Asymmetry of the space surrounding the dens is
unchanged compared to the brain MRI of 01/01/2012, again showing
mild right-sided widening. Cervical spine alignment is otherwise
normal. There is mild reversal of normal lordosis.

Skull base and vertebrae: No acute fracture.

Soft tissues and spinal canal: No prevertebral fluid or swelling. No
visible canal hematoma.

Disc levels: Multilevel uncovertebral and facet hypertrophy results
in moderate foraminal stenosis at left C3-4, left C4-5, right C5-6.
No bony spinal canal stenosis.

Upper chest: No pneumothorax, pulmonary nodule or pleural effusion.

Other: Normal visualized paraspinal cervical soft tissues.
IMPRESSION: 1. No acute intracranial abnormality. Old left cerebellar infarct,
but otherwise normal aging brain.
2. No skull fracture or facial fracture. Medium-size left
periorbital, nasal labial fold and malar eminence hematomata and
associated soft tissue swelling.
3. No acute fracture of the cervical spine.
4. Anterior subluxation at the right C1-C2 with that is probably
chronic, given the similar appearance on the MRI of 01/01/2012.
